# Patient Record
Sex: Female | Born: 1985 | Hispanic: No | Marital: Single | Smoking: Light tobacco smoker
Health system: Southern US, Community
[De-identification: ages and names within clinical notes are randomized; demographics above are authoritative.]

## PROBLEM LIST (undated history)

## (undated) DIAGNOSIS — B009 Herpesviral infection, unspecified: Secondary | ICD-10-CM

## (undated) DIAGNOSIS — R87619 Unspecified abnormal cytological findings in specimens from cervix uteri: Secondary | ICD-10-CM

## (undated) DIAGNOSIS — D219 Benign neoplasm of connective and other soft tissue, unspecified: Secondary | ICD-10-CM

## (undated) DIAGNOSIS — N926 Irregular menstruation, unspecified: Secondary | ICD-10-CM

## (undated) DIAGNOSIS — Z8619 Personal history of other infectious and parasitic diseases: Secondary | ICD-10-CM

## (undated) DIAGNOSIS — N760 Acute vaginitis: Secondary | ICD-10-CM

## (undated) DIAGNOSIS — N898 Other specified noninflammatory disorders of vagina: Secondary | ICD-10-CM

## (undated) DIAGNOSIS — Z319 Encounter for procreative management, unspecified: Secondary | ICD-10-CM

## (undated) DIAGNOSIS — B9689 Other specified bacterial agents as the cause of diseases classified elsewhere: Secondary | ICD-10-CM

## (undated) DIAGNOSIS — N939 Abnormal uterine and vaginal bleeding, unspecified: Secondary | ICD-10-CM

## (undated) HISTORY — DX: Personal history of other infectious and parasitic diseases: Z86.19

## (undated) HISTORY — DX: Abnormal uterine and vaginal bleeding, unspecified: N93.9

## (undated) HISTORY — PX: MOLE REMOVAL: SHX2046

## (undated) HISTORY — DX: Acute vaginitis: N76.0

## (undated) HISTORY — DX: Other specified bacterial agents as the cause of diseases classified elsewhere: B96.89

## (undated) HISTORY — DX: Unspecified abnormal cytological findings in specimens from cervix uteri: R87.619

## (undated) HISTORY — DX: Other specified noninflammatory disorders of vagina: N89.8

## (undated) HISTORY — DX: Irregular menstruation, unspecified: N92.6

## (undated) HISTORY — PX: THERAPEUTIC ABORTION: SHX798

## (undated) HISTORY — DX: Benign neoplasm of connective and other soft tissue, unspecified: D21.9

## (undated) HISTORY — DX: Herpesviral infection, unspecified: B00.9

## (undated) HISTORY — DX: Encounter for procreative management, unspecified: Z31.9

---

## 2010-02-28 ENCOUNTER — Other Ambulatory Visit: Admission: RE | Admit: 2010-02-28 | Discharge: 2010-02-28 | Payer: Self-pay | Admitting: Obstetrics and Gynecology

## 2012-03-12 ENCOUNTER — Other Ambulatory Visit (HOSPITAL_COMMUNITY)
Admission: RE | Admit: 2012-03-12 | Discharge: 2012-03-12 | Disposition: A | Discharge status: Home / Self Care | Payer: Self-pay | Source: Ambulatory Visit | Attending: Obstetrics & Gynecology | Admitting: Obstetrics & Gynecology

## 2012-03-12 DIAGNOSIS — Z01419 Encounter for gynecological examination (general) (routine) without abnormal findings: Secondary | ICD-10-CM | POA: Insufficient documentation

## 2012-09-22 ENCOUNTER — Ambulatory Visit: Payer: Self-pay | Admitting: Obstetrics & Gynecology

## 2012-10-21 ENCOUNTER — Other Ambulatory Visit: Payer: Self-pay | Admitting: Obstetrics & Gynecology

## 2013-03-22 ENCOUNTER — Ambulatory Visit (INDEPENDENT_AMBULATORY_CARE_PROVIDER_SITE_OTHER): Payer: Private Health Insurance - Indemnity | Admitting: Adult Health

## 2013-03-22 ENCOUNTER — Other Ambulatory Visit (HOSPITAL_COMMUNITY)
Admission: RE | Admit: 2013-03-22 | Discharge: 2013-03-22 | Disposition: A | Discharge status: Home / Self Care | Payer: Managed Care, Other (non HMO) | Source: Ambulatory Visit | Attending: Adult Health | Admitting: Adult Health

## 2013-03-22 ENCOUNTER — Encounter: Payer: Self-pay | Admitting: Adult Health

## 2013-03-22 VITALS — BP 110/60 | HR 78 | Ht 67.0 in | Wt 157.0 lb

## 2013-03-22 DIAGNOSIS — Z01419 Encounter for gynecological examination (general) (routine) without abnormal findings: Secondary | ICD-10-CM | POA: Insufficient documentation

## 2013-03-22 DIAGNOSIS — Z113 Encounter for screening for infections with a predominantly sexual mode of transmission: Secondary | ICD-10-CM | POA: Insufficient documentation

## 2013-03-22 DIAGNOSIS — Z309 Encounter for contraceptive management, unspecified: Secondary | ICD-10-CM

## 2013-03-22 LAB — CBC
Platelets: 305 10*3/uL (ref 150–400)
RDW: 13.6 % (ref 11.5–15.5)
WBC: 8.4 10*3/uL (ref 4.0–10.5)

## 2013-03-22 MED ORDER — NORGESTREL-ETHINYL ESTRADIOL 0.3-30 MG-MCG PO TABS: 1.0000 | ORAL_TABLET | Freq: Every day | ORAL | Status: DC

## 2013-03-22 NOTE — Patient Instructions (Addendum)
Physical in 1 year Mammogram at 41 Follow labs by my chart or phone

## 2013-03-22 NOTE — Progress Notes (Signed)
Patient ID: Anita Moody, female   DOB: 02-01-1986, 27 y.o.   MRN: [id number] History of Present Illness: Anita Moody is a 27 year old black female in for a pap and physical and has had some blurred vision at works, has glasses, and she has vaginal dryness at times.  Current Medications, Allergies, Past Medical History, Past Surgical History, Family History and Social History were reviewed in [employer] record.     Review of Systems: Patient denies any headaches,shortness of breath, chest pain, abdominal pain, problems with bowel movements, urination, or intercourse. No joint pain or mood changes, she has noticed blurred vision at times at work.And some vaginal dryness.    Physical Exam:BP 110/60  Pulse 78  Ht 5\' 7"  (1.702 m)  Wt 157 lb (71.215 kg)  BMI 24.58 kg/m2  LMP 02/19/2013 General:  Well developed, well nourished, no acute distress Skin:  Warm and dry Neck:  Midline trachea, normal thyroid Lungs; Clear to auscultation bilaterally Breast:  No dominant palpable mass, retraction, or nipple discharge Cardiovascular: Regular rate and rhythm Abdomen:  Soft, non tender, no hepatosplenomegaly Pelvic:  External genitalia is normal in appearance.  The vagina is normal in appearance. The cervix is bulbous.Pap with GC/CHL performed.  Uterus is felt to be normal size, shape, and contour.  No                adnexal masses or tenderness noted. Extremities:  No swelling or varicosities noted Psych:Alert and cooperative seems happy She requests STD testing.  Impression: Yearly gyn exam Contraceptive management Std testing History HSV    Plan: Refilled cryselle x 1 year Physical in 1 year Mammogram at 41  Check CBC,CMP,TSH,HIV,RPR, Hep B&C, follow up by phone or my chart Try luvena for vaginal moisture

## 2013-03-23 ENCOUNTER — Telehealth: Payer: Self-pay | Admitting: Adult Health

## 2013-03-23 LAB — COMPREHENSIVE METABOLIC PANEL
ALT: 16 U/L (ref 0–35)
AST: 18 U/L (ref 0–37)
Albumin: 4.3 g/dL (ref 3.5–5.2)
Alkaline Phosphatase: 38 U/L — ABNORMAL LOW (ref 39–117)
Calcium: 9.5 mg/dL (ref 8.4–10.5)
Chloride: 105 mEq/L (ref 96–112)
Creat: 0.76 mg/dL (ref 0.50–1.10)
Potassium: 4.1 mEq/L (ref 3.5–5.3)

## 2013-03-23 LAB — HEPATITIS B SURFACE ANTIGEN: Hepatitis B Surface Ag: NEGATIVE

## 2013-03-23 NOTE — Telephone Encounter (Signed)
Pt aware of labs  

## 2013-04-27 ENCOUNTER — Telehealth: Payer: Self-pay | Admitting: *Deleted

## 2013-04-27 NOTE — Telephone Encounter (Signed)
Pt wants to know what all STD labs was done on 03/22/2013. Pt informed RPR, GC/CHL on Pap, Hep C and B, HIV antibody and all WNL. Pt verbalized understanding.

## 2014-03-24 ENCOUNTER — Other Ambulatory Visit: Payer: Private Health Insurance - Indemnity | Admitting: Adult Health

## 2014-04-14 ENCOUNTER — Ambulatory Visit (INDEPENDENT_AMBULATORY_CARE_PROVIDER_SITE_OTHER): Payer: Private Health Insurance - Indemnity | Admitting: Adult Health

## 2014-04-14 ENCOUNTER — Encounter: Payer: Self-pay | Admitting: Adult Health

## 2014-04-14 VITALS — BP 120/82 | HR 80 | Ht 65.0 in | Wt 162.5 lb

## 2014-04-14 DIAGNOSIS — Z319 Encounter for procreative management, unspecified: Secondary | ICD-10-CM

## 2014-04-14 DIAGNOSIS — N898 Other specified noninflammatory disorders of vagina: Secondary | ICD-10-CM

## 2014-04-14 DIAGNOSIS — Z01419 Encounter for gynecological examination (general) (routine) without abnormal findings: Secondary | ICD-10-CM

## 2014-04-14 HISTORY — DX: Encounter for procreative management, unspecified: Z31.9

## 2014-04-14 HISTORY — DX: Other specified noninflammatory disorders of vagina: N89.8

## 2014-04-14 NOTE — Patient Instructions (Signed)
Try astro glide Increase fore play Sex every other day 7-24 of cycle,pee before sex Take vitamins

## 2014-04-14 NOTE — Progress Notes (Signed)
Patient ID: Anita Moody, female   DOB: [birth year], 28 y.o.   MRN: [id number] History of Present Illness: Anita Moody is a 28 year old in for a gyn physical, she had a normal pap 03/22/13.She complains of vaginal dryness with sex and wants to be pregnant in near future.   Current Medications, Allergies, Past Medical History, Past Surgical History, Family History and Social History were reviewed in [employer] record.     Review of Systems: Patient denies any headaches, blurred vision, shortness of breath, chest pain, abdominal pain, problems with bowel movements, urination, or intercourse. No joint or mood swings.    Physical Exam:BP 120/82  Pulse 80  Ht 5\' 5"  (1.651 m)  Wt 162 lb 8 oz (73.71 kg)  BMI 27.04 kg/m2  LMP 04/08/2014 General:  Well developed, well nourished, no acute distress Skin:  Warm and dry Neck:  Midline trachea, normal thyroid Lungs; Clear to auscultation bilaterally Breast:  No dominant palpable mass, retraction, or nipple discharge Cardiovascular: Regular rate and rhythm Abdomen:  Soft, non tender, no hepatosplenomegaly Pelvic:  External genitalia is normal in appearance.  The vagina is normal in appearance.  The cervix is bulbous.  Uterus is felt to be normal size, shape, and contour.  No   adnexal masses or tenderness noted. Extremities:  No swelling or varicosities noted Psych:  No mood changes,alert and cooperative,seems happy   Impression: Well woman exam no pap Vaginal dryness Desires pregnancy   Plan: Try astro glide and increased foreplay Take prenatal vitamins Discussed timing of sex, and can take 6-18 months of trying Physical in 1 year

## 2014-05-08 ENCOUNTER — Encounter: Payer: Self-pay | Admitting: Adult Health

## 2014-06-26 ENCOUNTER — Ambulatory Visit: Payer: Private Health Insurance - Indemnity | Admitting: Obstetrics and Gynecology

## 2014-07-21 ENCOUNTER — Encounter: Payer: Self-pay | Admitting: Obstetrics & Gynecology

## 2014-07-21 ENCOUNTER — Ambulatory Visit (INDEPENDENT_AMBULATORY_CARE_PROVIDER_SITE_OTHER): Payer: BLUE CROSS/BLUE SHIELD | Admitting: Obstetrics & Gynecology

## 2014-07-21 VITALS — BP 108/60 | Ht 65.0 in | Wt 168.0 lb

## 2014-07-21 DIAGNOSIS — N921 Excessive and frequent menstruation with irregular cycle: Secondary | ICD-10-CM

## 2014-07-21 DIAGNOSIS — N946 Dysmenorrhea, unspecified: Secondary | ICD-10-CM

## 2014-07-21 MED ORDER — MEGESTROL ACETATE 40 MG PO TABS: ORAL_TABLET | ORAL | Status: DC

## 2014-07-21 MED ORDER — IBUPROFEN 800 MG PO TABS: 800.0000 mg | ORAL_TABLET | Freq: Three times a day (TID) | ORAL | Status: DC | PRN

## 2014-07-21 NOTE — Progress Notes (Signed)
Patient ID: Anita Moody, female   DOB: March 10, 1986, 29 y.o.   MRN: [id number] Chief Complaint  Patient presents with  . discuss infertility    had ultrasound done morehead/ fibroids    HPI Pt presents complaining of bleeding since 07/09/2014, never happened before and is heavy clots and painful Seen at [facility] sonogram essentially normal with small 1 cm myoma  Here for follow up consult from that ER visit  ROS No burning with urination, frequency or urgency No nausea, vomiting or diarrhea Nor fever chills or other constitutional symptoms   Blood pressure 108/60, height 5\' 5"  (1.651 m), weight 168 lb (76.204 kg), last menstrual period 06/21/2014.  EXAM Reviewed sonogram:  Essentially normal ovaries normal small 1 cm myoma  Assessment/Plan:  Menometrorrhagia Dysmenorrhea  Ibuprofen 800 mg prn  Megestrol to stop bleeding  Follow up if does not respond as expected

## 2014-09-04 ENCOUNTER — Telehealth: Payer: Self-pay | Admitting: Obstetrics & Gynecology

## 2014-09-04 NOTE — Telephone Encounter (Signed)
Pt states she has not had any vaginal bleeding since began the Megace 40 mg Dr. Elonda Husky prescribed.   Does she need to continue the Megace 40 mg daily since she is no longer bleeding?

## 2014-09-05 NOTE — Telephone Encounter (Signed)
Yes continue it as directed She will bleed if she stops taking it

## 2014-09-05 NOTE — Telephone Encounter (Signed)
Pt informed to continue Megace as Dr. Elonda Husky prescribed.

## 2014-09-15 ENCOUNTER — Telehealth: Payer: Self-pay | Admitting: *Deleted

## 2014-09-15 NOTE — Telephone Encounter (Signed)
Pt called stating that she has been on megace 40mg  and has started some light bleeding that's lasting a little while. Pt stated that she was told that she has a small fibroid as well. I advised the pt that she would need to make an appointment with a provider to discuss the above problem. Pt verbalized understanding and the call was switched up front for the pt to make an appointment.

## 2014-09-22 ENCOUNTER — Ambulatory Visit (INDEPENDENT_AMBULATORY_CARE_PROVIDER_SITE_OTHER): Payer: BLUE CROSS/BLUE SHIELD | Admitting: Adult Health

## 2014-09-22 ENCOUNTER — Encounter: Payer: Self-pay | Admitting: Adult Health

## 2014-09-22 VITALS — BP 122/72 | Ht 66.0 in | Wt 170.5 lb

## 2014-09-22 DIAGNOSIS — N939 Abnormal uterine and vaginal bleeding, unspecified: Secondary | ICD-10-CM

## 2014-09-22 DIAGNOSIS — Z319 Encounter for procreative management, unspecified: Secondary | ICD-10-CM

## 2014-09-22 DIAGNOSIS — Z3202 Encounter for pregnancy test, result negative: Secondary | ICD-10-CM | POA: Diagnosis not present

## 2014-09-22 HISTORY — DX: Abnormal uterine and vaginal bleeding, unspecified: N93.9

## 2014-09-22 LAB — POCT URINE PREGNANCY: PREG TEST UR: NEGATIVE

## 2014-09-22 MED ORDER — NORETHIN ACE-ETH ESTRAD-FE 1-20 MG-MCG(24) PO CHEW: 1.0000 | CHEWABLE_TABLET | Freq: Every day | ORAL | Status: DC

## 2014-09-22 NOTE — Patient Instructions (Signed)
Stop acticlate Stop megace Start minastrin Sunday Use condoms for 1 month Take prenatal vitamins Return in 3 months

## 2014-09-22 NOTE — Progress Notes (Signed)
Subjective:     Patient ID: Anita Moody, female   DOB: August 23, 1985, 29 y.o.   MRN: [id number]  HPI Anita Moody is a 29 year old Hispanic female in for bleeding on and off since December, megace helped but still spotted.Stil wants to be pregnant in near future.Will stop megace and try Minastrin and see how periods are.No pain.  Review of Systems  + bleeding, all other systems negative  Reviewed past medical,surgical, social and family history. Reviewed medications and allergies.     Objective:   Physical Exam BP 122/72 mmHg  Ht 5\' 6"  (1.676 m)  Wt 170 lb 8 oz (77.338 kg)  BMI 27.53 kg/m2  LMP 06/20/2014 (Approximate) UPT negative, Skin warm and dry.Pelvic: external genitalia is normal in appearance no lesions, vagina: tan discharge without odor,urethra has no lesions or masses noted, cervix:smooth and bulbous, uterus: normal size, shape and contour, non tender, no masses felt, adnexa: no masses or tenderness noted. Bladder is non tender and no masses felt. GC/CHL obtained.     Assessment:     AUB Desires pregnancy    Plan:    GC/CHL sent Stop megace Stop acticlate Rx minastrin take 1 daily start Sunday, use condoms for 1 month, 6 refills Take prenatal vitamins Follow up in 6 months

## 2014-09-24 LAB — GC/CHLAMYDIA PROBE AMP
Chlamydia trachomatis, NAA: NEGATIVE
Neisseria gonorrhoeae by PCR: NEGATIVE

## 2014-10-09 ENCOUNTER — Telehealth: Payer: Self-pay | Admitting: Adult Health

## 2014-10-09 NOTE — Telephone Encounter (Signed)
Spoke with pt. Pt has been on Minastrin x 3 weeks. Previously, she was on Megace and that helped with cramping. She stopped Megace and now she is having heavy bleeding and cramping. Pt don't want to go back on Megace. What do you advise? Thanks!! [place]

## 2014-10-09 NOTE — Telephone Encounter (Signed)
Spoke with pt letting her know to continue taking Minastrin and not to stop it. Can take a few cycles to get period under control sometimes. Pt taking Ibuprofen for cramps. Advised to continue. Pt voiced understanding. [place]

## 2014-12-25 ENCOUNTER — Ambulatory Visit: Payer: BLUE CROSS/BLUE SHIELD | Admitting: Adult Health

## 2015-01-02 ENCOUNTER — Telehealth: Payer: Self-pay | Admitting: *Deleted

## 2015-01-03 NOTE — Telephone Encounter (Signed)
Spoke with pt. Pt was put on birth control pills due to abnormal bleeding. After the 2nd month, she misplaced her pills after her period. This month her cycle has been going on for 12 days. She had Megace from before, and she started that yesterday and bleeding has lightened up. Pt has a scheduled appt next week with JAG since that's who she has been seeing for this. I advised since she was having no pain and bleeding was getting better, next week would be fine but if anything changed between now and then, call office. Pt voiced understanding. [place]

## 2015-01-09 ENCOUNTER — Ambulatory Visit: Payer: BLUE CROSS/BLUE SHIELD | Admitting: Adult Health

## 2015-01-17 ENCOUNTER — Ambulatory Visit (INDEPENDENT_AMBULATORY_CARE_PROVIDER_SITE_OTHER): Payer: BLUE CROSS/BLUE SHIELD | Admitting: Adult Health

## 2015-01-17 ENCOUNTER — Encounter: Payer: Self-pay | Admitting: Adult Health

## 2015-01-17 VITALS — BP 130/60 | HR 80 | Ht 66.0 in | Wt 171.0 lb

## 2015-01-17 DIAGNOSIS — Z3202 Encounter for pregnancy test, result negative: Secondary | ICD-10-CM

## 2015-01-17 DIAGNOSIS — Z319 Encounter for procreative management, unspecified: Secondary | ICD-10-CM

## 2015-01-17 DIAGNOSIS — N939 Abnormal uterine and vaginal bleeding, unspecified: Secondary | ICD-10-CM

## 2015-01-17 LAB — POCT URINE PREGNANCY: Preg Test, Ur: NEGATIVE

## 2015-01-17 MED ORDER — PRENATAL PLUS 27-1 MG PO TABS: 1.0000 | ORAL_TABLET | Freq: Every day | ORAL | Status: DC

## 2015-01-17 NOTE — Patient Instructions (Signed)
Take prenatal vitamin Stop megace Keep period calendar  Call if bleeding longer than 7 days will get [place] Have sex every other day, day 7-24

## 2015-01-17 NOTE — Progress Notes (Signed)
Subjective:     Patient ID: Anita Moody, female   DOB: 01-Jan-1986, 29 y.o.   MRN: [id number]  HPI Anita Moody is a 29 year old Hispanic female in complaining of abnormal bleeding, she was on minastrin and was fine but stopped it after 3 packs and then had bleeding for 12 days and started megace, has no bleeding now and she still wants to be pregnant.  Review of Systems   Patient denies any headaches, hearing loss, fatigue, blurred vision, shortness of breath, chest pain, abdominal pain, problems with bowel movements, urination, or intercourse. No joint pain or mood swings.See HPI for positives Reviewed past medical,surgical, social and family history. Reviewed medications and allergies.     Objective:   Physical Exam BP 130/60 mmHg  Pulse 80  Ht 5\' 6"  (1.676 m)  Wt 171 lb (77.565 kg)  BMI 27.61 kg/m2  LMP 12/21/2014 (Approximate) UPT negative, Skin warm and dry.Pelvic: external genitalia is normal in appearance no lesions, vagina: scant discharge without odor,urethra has no lesions or masses noted, cervix:smooth and bulbous, uterus: normal size, shape and contour, non tender, no masses felt, adnexa: no masses or tenderness noted. Bladder is non tender and no masses felt.No bleeding now    Assessment:     AUB Desires pregnancy    Plan:     Rx prenatal plus #30 take 1 daily with 11 refills Stop megace Keep period calendar Call if bleeds longer than 7 days, will get [place] Have sex every other day day 7-24 of cycle Return in 3 months for pap and physical

## 2015-02-20 ENCOUNTER — Ambulatory Visit: Payer: BLUE CROSS/BLUE SHIELD | Admitting: Adult Health

## 2015-04-20 ENCOUNTER — Other Ambulatory Visit: Payer: BLUE CROSS/BLUE SHIELD | Admitting: Adult Health

## 2015-06-13 ENCOUNTER — Other Ambulatory Visit: Payer: BLUE CROSS/BLUE SHIELD | Admitting: Adult Health

## 2015-08-02 ENCOUNTER — Other Ambulatory Visit: Payer: Self-pay | Admitting: Obstetrics & Gynecology

## 2015-09-26 ENCOUNTER — Telehealth: Payer: Self-pay | Admitting: Adult Health

## 2015-09-26 ENCOUNTER — Other Ambulatory Visit: Payer: Self-pay | Admitting: Adult Health

## 2015-09-26 MED ORDER — METRONIDAZOLE 500 MG PO TABS: 500.0000 mg | ORAL_TABLET | Freq: Two times a day (BID) | ORAL | Status: DC

## 2015-09-26 NOTE — Telephone Encounter (Signed)
Left message will rx flagyl but if not better need to see by appt

## 2015-09-26 NOTE — Telephone Encounter (Signed)
Pt requesting refill of Flagyl (would prefer tablet) for BV. Pt states Derrek Monaco, NP has prescribed med in the past for on going BV she had after her menstrual cycles. Please advise.

## 2015-09-27 ENCOUNTER — Other Ambulatory Visit: Payer: Self-pay | Admitting: Adult Health

## 2015-09-27 MED ORDER — NORETHIN ACE-ETH ESTRAD-FE 1-20 MG-MCG(24) PO CHEW: 1.0000 | CHEWABLE_TABLET | Freq: Every day | ORAL | Status: DC

## 2015-09-27 NOTE — Telephone Encounter (Signed)
Spoke with pt. Pt has an appt 4/21 for pap. She is requesting a refill on Minastrin. Pt not due to start new pack until Sunday. JAG advised she would refill it. [place]

## 2015-09-27 NOTE — Telephone Encounter (Signed)
Left message x 1. JSY 

## 2015-10-26 ENCOUNTER — Other Ambulatory Visit: Payer: BLUE CROSS/BLUE SHIELD | Admitting: Adult Health

## 2015-11-16 ENCOUNTER — Ambulatory Visit (INDEPENDENT_AMBULATORY_CARE_PROVIDER_SITE_OTHER): Payer: BLUE CROSS/BLUE SHIELD | Admitting: Adult Health

## 2015-11-16 ENCOUNTER — Encounter: Payer: Self-pay | Admitting: Adult Health

## 2015-11-16 ENCOUNTER — Other Ambulatory Visit (HOSPITAL_COMMUNITY)
Admission: RE | Admit: 2015-11-16 | Discharge: 2015-11-16 | Disposition: A | Discharge status: Home / Self Care | Payer: BLUE CROSS/BLUE SHIELD | Source: Ambulatory Visit | Attending: Adult Health | Admitting: Adult Health

## 2015-11-16 VITALS — BP 124/80 | HR 84 | Ht 65.0 in | Wt 166.0 lb

## 2015-11-16 DIAGNOSIS — Z01411 Encounter for gynecological examination (general) (routine) with abnormal findings: Secondary | ICD-10-CM

## 2015-11-16 DIAGNOSIS — Z01419 Encounter for gynecological examination (general) (routine) without abnormal findings: Secondary | ICD-10-CM

## 2015-11-16 DIAGNOSIS — Z113 Encounter for screening for infections with a predominantly sexual mode of transmission: Secondary | ICD-10-CM | POA: Insufficient documentation

## 2015-11-16 DIAGNOSIS — Z319 Encounter for procreative management, unspecified: Secondary | ICD-10-CM

## 2015-11-16 DIAGNOSIS — N926 Irregular menstruation, unspecified: Secondary | ICD-10-CM

## 2015-11-16 HISTORY — DX: Irregular menstruation, unspecified: N92.6

## 2015-11-16 MED ORDER — PRENATAL PLUS 27-1 MG PO TABS: 1.0000 | ORAL_TABLET | Freq: Every day | ORAL | Status: DC

## 2015-11-16 NOTE — Progress Notes (Signed)
Patient ID: Anita Moody, female   DOB: 12-07-1985, 30 y.o.   MRN: [id number] History of Present Illness: Anita Moody is a 30 year old female in for a well woman gyn exam and pap,she is complaining of irregular periods, may spot up to 2 weeks, is light  And has no pain, and would still like to be pregnant.   Current Medications, Allergies, Past Medical History, Past Surgical History, Family History and Social History were reviewed in [employer] record.     Review of Systems: Patient denies any headaches, hearing loss, fatigue, blurred vision, shortness of breath, chest pain, abdominal pain, problems with bowel movements, urination, or intercourse. No joint pain or mood swings.See HPI for positives.    Physical Exam:BP 124/80 mmHg  Pulse 84  Ht 5\' 5"  (1.651 m)  Wt 166 lb (75.297 kg)  BMI 27.62 kg/m2  LMP 10/24/2015 (Approximate) General:  Well developed, well nourished, no acute distress Skin:  Warm and dry,no rashes Neck:  Midline trachea, normal thyroid, good ROM, no lymphadenopathy Lungs; Clear to auscultation bilaterally Breast:  No dominant palpable mass, retraction, or nipple discharge Cardiovascular: Regular rate and rhythm Abdomen:  Soft, non tender, no hepatosplenomegaly Pelvic:  External genitalia is normal in appearance, no lesions.  The vagina is normal in appearance. Urethra has no lesions or masses. The cervix is bulbous and smooth, pap with GC/CHL performed.  Uterus is felt to be normal size, shape, and contour.  No adnexal masses or tenderness noted.Bladder is non tender, no masses felt. Extremities/musculoskeletal:  No swelling or varicosities noted, no clubbing or cyanosis Psych:  No mood changes, alert and cooperative,seems happy   Impression: Well woman gyn exam and pap Irregular periods Desires pregnancy    Plan: Rx prenatal plus #30 take 1 daily with 11 refills Keep period calendar and call with next period, will check progesterone level  day 21 Physical in 1 year, pap in 3 if normal

## 2015-11-16 NOTE — Patient Instructions (Addendum)
Take prenatal vitamins Call with next period will check progesterone level Keep period calendar

## 2015-11-20 LAB — CYTOLOGY - PAP

## 2015-11-22 ENCOUNTER — Encounter: Payer: Self-pay | Admitting: Adult Health

## 2015-11-22 ENCOUNTER — Telehealth: Payer: Self-pay | Admitting: Adult Health

## 2015-11-22 DIAGNOSIS — R87619 Unspecified abnormal cytological findings in specimens from cervix uteri: Secondary | ICD-10-CM

## 2015-11-22 HISTORY — DX: Unspecified abnormal cytological findings in specimens from cervix uteri: R87.619

## 2015-11-22 NOTE — Telephone Encounter (Signed)
Pt aware pap LGSIL, needs colpo, will make appt with Dr Elonda Husky

## 2015-11-23 ENCOUNTER — Ambulatory Visit (INDEPENDENT_AMBULATORY_CARE_PROVIDER_SITE_OTHER): Payer: BLUE CROSS/BLUE SHIELD | Admitting: Obstetrics & Gynecology

## 2015-11-23 ENCOUNTER — Encounter: Payer: Self-pay | Admitting: Obstetrics & Gynecology

## 2015-11-23 VITALS — BP 120/80 | HR 72 | Ht 65.0 in | Wt 168.0 lb

## 2015-11-23 DIAGNOSIS — R87612 Low grade squamous intraepithelial lesion on cytologic smear of cervix (LGSIL): Secondary | ICD-10-CM

## 2015-11-23 DIAGNOSIS — IMO0002 Reserved for concepts with insufficient information to code with codable children: Secondary | ICD-10-CM

## 2015-11-23 DIAGNOSIS — N870 Mild cervical dysplasia: Secondary | ICD-10-CM

## 2015-11-23 DIAGNOSIS — N898 Other specified noninflammatory disorders of vagina: Secondary | ICD-10-CM | POA: Diagnosis not present

## 2015-11-23 DIAGNOSIS — B3731 Acute candidiasis of vulva and vagina: Secondary | ICD-10-CM

## 2015-11-23 DIAGNOSIS — B373 Candidiasis of vulva and vagina: Secondary | ICD-10-CM

## 2015-11-23 MED ORDER — TERCONAZOLE 0.4 % VA CREA: 1.0000 | TOPICAL_CREAM | Freq: Every day | VAGINAL | Status: DC

## 2015-11-23 NOTE — Progress Notes (Signed)
Patient ID: Anita Moody, female   DOB: [birth year], 30 y.o.   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 The prior pap showed no abnormalities.  Prior cervical/vaginal disease: . Prior cervical treatment: .  Smoker:  Yes.  1 cigarette/week New sexual partner:  No.  : time frame:    History of abnormal Pap: yes, several years ago  Procedure Details  The risks and benefits of the procedure and Written informed consent obtained.  Speculum placed in vagina and excellent visualization of cervix achieved, cervix swabbed x 3 with acetic acid solution.  Findings: Cervix: no visible lesions, no mosaicism, no punctation and no abnormal vasculature; no biopsies taken. Vaginal inspection: normal without visible lesions. Vulvar colposcopy: vulvar colposcopy not performed.  Specimens: none  Complications: none.  Plan: Colposcopy without even any HPV changes, repeat Pap next May/1 year  Also has a yeast infection:  Meds ordered this encounter  Medications  . terconazole (TERAZOL 7) 0.4 % vaginal cream    Sig: Place 1 applicator vaginally at bedtime.    Dispense:  45 g    Refill:  0

## 2015-12-04 ENCOUNTER — Telehealth: Payer: Self-pay | Admitting: *Deleted

## 2015-12-04 DIAGNOSIS — Z319 Encounter for procreative management, unspecified: Secondary | ICD-10-CM

## 2015-12-04 NOTE — Telephone Encounter (Signed)
Get progesterone level 6/21, have sex every other day, day 7-24 of cycle

## 2015-12-04 NOTE — Telephone Encounter (Signed)
Spoke with pt. Pt started period this am. Wanted to let you know so she could have labs drawn for ovulation. Thanks!! [place]

## 2015-12-04 NOTE — Telephone Encounter (Signed)
Left message x 1. JSY 

## 2016-01-14 ENCOUNTER — Telehealth: Payer: Self-pay | Admitting: Adult Health

## 2016-01-14 NOTE — Telephone Encounter (Signed)
Pt started period 7/9 and wants progesterone level checked, do it 7/29

## 2016-02-03 LAB — PROGESTERONE: PROGESTERONE: 0.3 ng/mL

## 2016-02-05 ENCOUNTER — Telehealth: Payer: Self-pay | Admitting: Obstetrics & Gynecology

## 2016-02-05 NOTE — Telephone Encounter (Signed)
Pt informed of Progesterone level of 0.3 from 02/02/2016. Please advise.

## 2016-02-05 NOTE — Telephone Encounter (Signed)
Pt aware of progesterone level and did not ovulate, if wants clomid make appt to discuss

## 2016-02-05 NOTE — Telephone Encounter (Signed)
Pt called stating that she would like a call back from the nurse regarding her results. Please contact pt

## 2016-02-11 ENCOUNTER — Encounter: Payer: Self-pay | Admitting: Adult Health

## 2016-02-11 ENCOUNTER — Ambulatory Visit (INDEPENDENT_AMBULATORY_CARE_PROVIDER_SITE_OTHER): Payer: BLUE CROSS/BLUE SHIELD | Admitting: Adult Health

## 2016-02-11 VITALS — BP 100/62 | HR 70 | Ht 65.0 in | Wt 172.0 lb

## 2016-02-11 DIAGNOSIS — Z319 Encounter for procreative management, unspecified: Secondary | ICD-10-CM

## 2016-02-11 MED ORDER — CLOMIPHENE CITRATE 50 MG PO TABS: 50.0000 mg | ORAL_TABLET | Freq: Every day | ORAL | 1 refills | Status: DC

## 2016-02-11 NOTE — Progress Notes (Signed)
Subjective:     Patient ID: Anita Moody, female   DOB: 1985-08-30, 30 y.o.   MRN: [id number]  HPI Anita Moody is a 30 year old female in to discuss clomid to induce ovulation, she has been trying for about 2 years, her last progesterone level was 0.3.  Review of Systems Patient denies any headaches, hearing loss, fatigue, blurred vision, shortness of breath, chest pain, abdominal pain, problems with bowel movements, urination, or intercourse. No joint pain or mood swings. Reviewed past medical,surgical, social and family history. Reviewed medications and allergies.     Objective:   Physical Exam BP 100/62 (BP Location: Left Arm, Patient Position: Sitting, Cuff Size: Normal)   Pulse 70   Ht 5\' 5"  (1.651 m)   Wt 172 lb (78 kg)   LMP 02/09/2016   BMI 28.62 kg/m  Skin warm and dry. Lungs: clear to ausculation bilaterally. Cardiovascular: regular rate and rhythm.   Discussed clomid and she wants to try it. She is aware 7 % chance of twins and <1 % for triplets.  Assessment:     Desires pregnancy    Plan:       Check progesterone level 8/25 Rx clomid 50 mg #5 take 1 daily starting today for 5 days with 1 refill Have sex every other day, day 7-24 of cycle Will talk when progesterone back  Review handout on clomid

## 2016-02-11 NOTE — Patient Instructions (Signed)
CLOMID INSTRUCTIONS  WHY USE IT? Clomid helps your ovaries to release eggs (ovulate).  HOW TO USE IT? Clomid is taken as a pill usually on days 5,6,7,8, & 9 of your cycle.  Day 1 is the first day of your period. The dose or duration may be changed to achieve ovulation.  Provera (progesterone) may first be used to bring on a period for some patients. The day of ovulation on Clomid is usually between cycle day 14 and 17.  Having sexual intercourse at least every other day between cycle day 13 and 18 will improve your chances of becoming pregnant during the Clomid cycle.  You may monitor your ovulation using basal body temperature charts or with ovulation kits.  If using the ovulation predictor kits, having intercourse the day of the surge and the two days following is recommended. If you get your period, call when it starts for an appointment with your doctor, so that an exam may be done, and another Clomid cycle can be considered if appropriate. If you do not get a period by day 35 of the cycle, please get a blood pregnancy test.  If it is negative, speak to your doctor for instructions to bring on another period and to plan a follow-up appointment.  THINGS TO KNOW: If you get pregnant while using Clomid, your chance of twins is 7%m and triplets is less than 1%. Some studies have suggested the use of "fertility drugs" may increase your risk of ovarian cancers in the future.  It is unclear if these drugs increase the risk, or people who have problems with fertility are prone for these cancers.  If there is an actual risk, it is very low.  If you have a history of liver problems or ovarian cancer, it may be wise to avoid this medication.  SIDE EFFECTS:  The most common side effect is hot flashes (20%).  Breast tenderness, headaches, nausea, bloating may also occur at different times.  Less than 3/1,000 people have dryness or loss of hair.  Persistent ovarian cysts may form from the use of this  medication.  Ovarian hyperstimulation syndrome is a rare side effect at low doses.  Visual changes like flashes of light or blurring.  Start clomid today  Sex every day day 7-24 of cycle  Check progesterone level 8/25

## 2016-03-17 ENCOUNTER — Telehealth: Payer: Self-pay | Admitting: Adult Health

## 2016-03-17 DIAGNOSIS — Z319 Encounter for procreative management, unspecified: Secondary | ICD-10-CM

## 2016-03-17 NOTE — Telephone Encounter (Signed)
Spoke with pt. Pt's period started Sunday, Sept 3 and stopped Tuesday. Pt states she is not bleeding her full period, just bleeding 1-2 days. She takes Clomid when she's bleeding. Can she take a pill to cause period? Also, when can she have progesterone checked? Thanks! [place]

## 2016-03-17 NOTE — Telephone Encounter (Signed)
Pt was advised to continue Clomid, take for 5 days and can have Progesterone checked on 9/23 if she desires. Pt does want Progesterone checked so I will put order in. Pt voiced understanding. [place]

## 2016-03-30 LAB — PROGESTERONE: Progesterone: 5.3 ng/mL

## 2016-04-01 ENCOUNTER — Telehealth: Payer: Self-pay | Admitting: Adult Health

## 2016-04-01 NOTE — Telephone Encounter (Signed)
Left message progesterone was 5.3, which is better, let me know if has period or not

## 2016-04-02 ENCOUNTER — Telehealth: Payer: Self-pay | Admitting: *Deleted

## 2016-04-02 NOTE — Telephone Encounter (Signed)
Progesterone level 5.3 which better, call if period starts, or not, message left

## 2016-04-09 ENCOUNTER — Telehealth: Payer: Self-pay | Admitting: Adult Health

## 2016-04-09 NOTE — Telephone Encounter (Signed)
Left message x 1. JSY 

## 2016-04-10 ENCOUNTER — Telehealth: Payer: Self-pay | Admitting: Adult Health

## 2016-04-10 NOTE — Telephone Encounter (Signed)
Spoke with pt. Pt started period Tuesday, 10/3 and she took her 1st Clomid on Wednesday, 10/4. Pt wanted to let you know. [place]

## 2016-04-10 NOTE — Telephone Encounter (Signed)
Duplicate message. [place]

## 2016-04-10 NOTE — Telephone Encounter (Signed)
Left message to have sex days 7-24 of cycle, and if wants progesterone level checked that would be 10/23 just let me know

## 2016-10-27 ENCOUNTER — Other Ambulatory Visit: Payer: Self-pay | Admitting: Obstetrics & Gynecology

## 2016-11-04 ENCOUNTER — Telehealth: Payer: Self-pay | Admitting: *Deleted

## 2016-11-04 MED ORDER — METRONIDAZOLE 500 MG PO TABS: 500.0000 mg | ORAL_TABLET | Freq: Two times a day (BID) | ORAL | 1 refills | Status: DC

## 2016-11-04 NOTE — Telephone Encounter (Signed)
She complains of BV has discharge with odor, will rx flagyl, has used in past and it worked, has appt 11/18/16

## 2016-11-04 NOTE — Telephone Encounter (Signed)
Patient called stating she just finished her cycle and now has BV (odor, discharge). She would like for you to refill Flagyl if possible. She has an appointment on 5/15 for pap/physical. Please advise.

## 2016-11-18 ENCOUNTER — Other Ambulatory Visit: Payer: BLUE CROSS/BLUE SHIELD | Admitting: Adult Health

## 2017-02-02 ENCOUNTER — Other Ambulatory Visit: Payer: Self-pay | Admitting: Obstetrics and Gynecology

## 2017-11-09 ENCOUNTER — Emergency Department (HOSPITAL_COMMUNITY)
Admission: EM | Admit: 2017-11-09 | Discharge: 2017-11-09 | Disposition: A | Discharge status: Home / Self Care | Payer: Managed Care, Other (non HMO) | Attending: Emergency Medicine | Admitting: Emergency Medicine

## 2017-11-09 ENCOUNTER — Encounter (HOSPITAL_COMMUNITY): Payer: Self-pay | Admitting: Emergency Medicine

## 2017-11-09 ENCOUNTER — Emergency Department (HOSPITAL_COMMUNITY): Payer: Managed Care, Other (non HMO)

## 2017-11-09 ENCOUNTER — Other Ambulatory Visit: Payer: Self-pay

## 2017-11-09 DIAGNOSIS — M542 Cervicalgia: Secondary | ICD-10-CM | POA: Insufficient documentation

## 2017-11-09 DIAGNOSIS — M549 Dorsalgia, unspecified: Secondary | ICD-10-CM | POA: Diagnosis present

## 2017-11-09 DIAGNOSIS — R52 Pain, unspecified: Secondary | ICD-10-CM

## 2017-11-09 DIAGNOSIS — F17210 Nicotine dependence, cigarettes, uncomplicated: Secondary | ICD-10-CM | POA: Insufficient documentation

## 2017-11-09 MED ORDER — METHOCARBAMOL 500 MG PO TABS: 500.0000 mg | ORAL_TABLET | Freq: Two times a day (BID) | ORAL | 0 refills | Status: AC

## 2017-11-09 MED ORDER — DICLOFENAC SODIUM 50 MG PO TBEC: 50.0000 mg | DELAYED_RELEASE_TABLET | Freq: Two times a day (BID) | ORAL | 0 refills | Status: AC

## 2017-11-09 NOTE — ED Triage Notes (Signed)
Pt c/o of upper back pain.  Denies any new injuries.  Pt states "its probably related to my work"

## 2017-11-09 NOTE — Discharge Instructions (Addendum)
Return if any problems.

## 2017-11-10 NOTE — ED Provider Notes (Signed)
[facility] EMERGENCY DEPARTMENT Provider Note   CSN: [id number] Arrival date & time: 11/09/17  1237     History   Chief Complaint Chief Complaint  Patient presents with  . Back Pain    HPI Anita Moody is a 32 y.o. female.  The history is provided by the patient. No language interpreter was used.  Back Pain   This is a new problem. The current episode started more than 1 week ago. The problem occurs constantly. The pain is associated with no known injury. The pain is present in the thoracic spine. The quality of the pain is described as aching. The pain does not radiate. The pain is moderate. The symptoms are aggravated by certain positions. Stiffness is present all day. She has tried nothing for the symptoms.  Pt reports she stands on hard floors at work.  Pt complains of pain in her neck and her upper back.     Past Medical History:  Diagnosis Date  . Abnormal Pap smear of cervix 11/22/2015  . Abnormal uterine bleeding (AUB) 09/22/2014  . BV (bacterial vaginosis)   . Fibroids   . HSV-2 (herpes simplex virus 2) infection   . Hx of chlamydia infection   . Hx of gonorrhea   . Irregular periods 11/16/2015  . Patient desires pregnancy 04/14/2014  . Vaginal dryness 04/14/2014    Patient Active Problem List   Diagnosis Date Noted  . Abnormal Pap smear of cervix 11/22/2015  . Irregular periods 11/16/2015  . Abnormal uterine bleeding (AUB) 09/22/2014  . Vaginal dryness 04/14/2014  . Patient desires pregnancy 04/14/2014    Past Surgical History:  Procedure Laterality Date  . MOLE REMOVAL     right thumb  . THERAPEUTIC ABORTION     x 2     OB History    Gravida  4   Para  2   Term      Preterm      AB  2   Living  2     SAB      TAB  2   Ectopic      Multiple      Live Births  2            Home Medications    Prior to Admission medications   Medication Sig Start Date End Date Taking? Authorizing Provider  ibuprofen (ADVIL,MOTRIN) 200 MG tablet  Take 800 mg by mouth every 6 (six) hours as needed.   Yes [provider]  Multiple Vitamin (MULTIVITAMIN) tablet Take 1 tablet by mouth daily.   Yes [provider]  diclofenac (VOLTAREN) 50 MG EC tablet Take 1 tablet (50 mg total) by mouth 2 (two) times daily. 11/09/17   Fransico Meadow, PA-C  ibuprofen (ADVIL,MOTRIN) 800 MG tablet TAKE (1) TABLET EVERY EIGHT HOURS AS NEEDED. 10/27/16   Florian Buff, MD  methocarbamol (ROBAXIN) 500 MG tablet Take 1 tablet (500 mg total) by mouth 2 (two) times daily. 11/09/17   Fransico Meadow, PA-C    Family History Family History  Problem Relation Age of Onset  . Diabetes Father   . Hyperlipidemia Father   . Other Father        heart issues  . Other Paternal Aunt        kidney stones  . Diabetes Paternal Grandfather   . Dementia Paternal Grandmother   . Other Brother        heart murmur  . Other Son  inactive heart murmur  . Fibroids Mother     Social History Social History   Tobacco Use  . Smoking status: Light Tobacco Smoker    Years: 7.00    Types: Cigarettes  . Smokeless tobacco: Never Used  . Tobacco comment: 1-2 cig every other day  Substance Use Topics  . Alcohol use: Yes    Alcohol/week: 2.0 oz    Types: 4 Standard drinks or equivalent per week    Comment: occ  . Drug use: No     Allergies   Coconut fatty acids   Review of Systems Review of Systems  Musculoskeletal: Positive for back pain.  All other systems reviewed and are negative.    Physical Exam Updated Vital Signs BP 136/86 (BP Location: Right Arm)   Pulse 81   Temp 98.2 F (36.8 C) (Oral)   Resp 17   Ht 5\' 5"  (1.651 m)   Wt 76.5 kg (168 lb 9 oz)   LMP 10/26/2017   SpO2 100%   BMI 28.05 kg/m   Physical Exam  Constitutional: She appears well-developed and well-nourished.  HENT:  Head: Normocephalic.  Eyes: Pupils are equal, round, and reactive to light.  Cardiovascular: Normal rate.  Pulmonary/Chest: Effort normal.    Musculoskeletal: She exhibits tenderness.  Tender upper thoracic spine and cervical spine,  Tender trapezius to palpation   Neurological: She is alert.  Skin: Skin is warm.  Psychiatric: She has a normal mood and affect.  Nursing note and vitals reviewed.    ED Treatments / Results  Labs (all labs ordered are listed, but only abnormal results are displayed) Labs Reviewed - No data to display  EKG None  Radiology Dg Cervical Spine Complete  Result Date: 11/09/2017 CLINICAL DATA:  Neck pain for couple months. Worsening. No known injury. EXAM: CERVICAL SPINE - COMPLETE 4+ VIEW COMPARISON:  None. FINDINGS: Mild straightening of the normal cervical lordosis. No significant subluxation. No compression fracture or vertebral body abnormality. No prevertebral soft tissue swelling. No foraminal narrowing. AP and odontoid views unremarkable. IMPRESSION: Other than mild straightening of the normal cervical lordosis, unremarkable cervical spine radiographs. Electronically Signed   By: Staci Righter M.D.   On: 11/09/2017 13:51   Dg Thoracic Spine W/swimmers  Result Date: 11/09/2017 CLINICAL DATA:  Back pain.  No known injury. EXAM: THORACIC SPINE - 3 VIEWS COMPARISON:  None. FINDINGS: There is no evidence of thoracic spine fracture. Alignment is normal. No other significant bone abnormalities are identified. IMPRESSION: Negative. Electronically Signed   By: Staci Righter M.D.   On: 11/09/2017 13:52    Procedures Procedures (including critical care time)  Medications Ordered in ED Medications - No data to display   Initial Impression / Assessment and Plan / ED Course  I have reviewed the triage vital signs and the nursing notes.  Pertinent labs & imaging results that were available during my care of the patient were reviewed by me and considered in my medical decision making (see chart for details).     An After Visit Summary was printed and given to the patient.   Final Clinical  Impressions(s) / ED Diagnoses   Final diagnoses:  Neck pain    ED Discharge Orders        Ordered    methocarbamol (ROBAXIN) 500 MG tablet  2 times daily     11/09/17 1501    diclofenac (VOLTAREN) 50 MG EC tablet  2 times daily     11/09/17 1501  Fransico Meadow, PA-C 11/10/17 [id number]    Nat Christen, MD 11/17/17 [phone]

## 2019-03-23 IMAGING — DX DG CERVICAL SPINE COMPLETE 4+V
5 series · 5 of 5 positions shown · non-contrast
Comparison: None.

CLINICAL DATA: Neck pain for couple months. Worsening. No known
injury.

EXAM:
CERVICAL SPINE - COMPLETE 4+ VIEW

[c-spine lat]
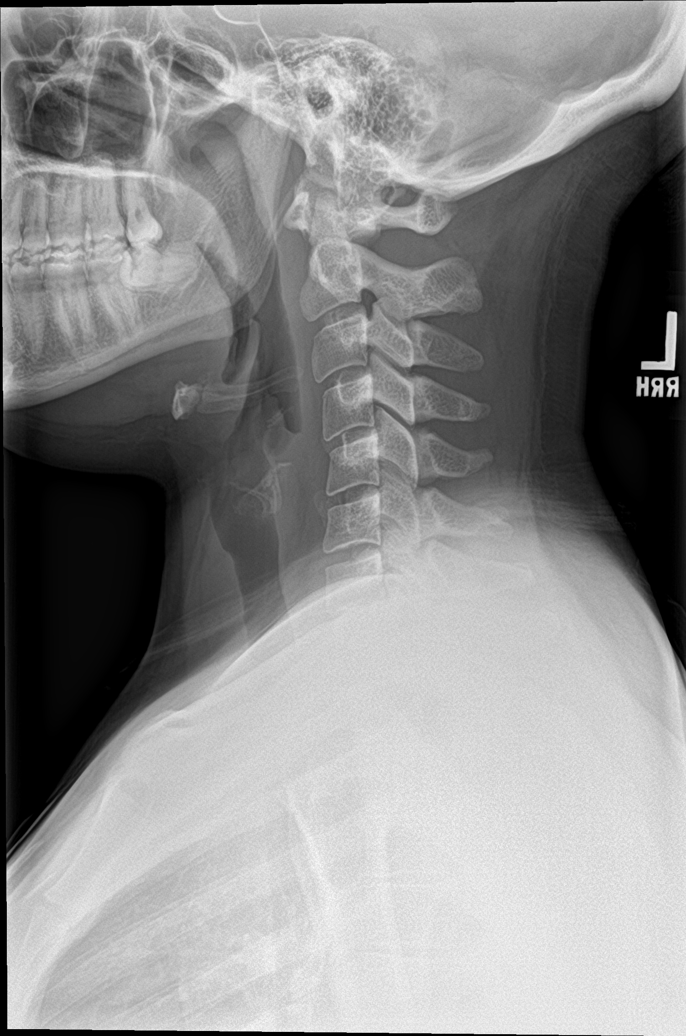

[c-spine obl (1 of 2)]
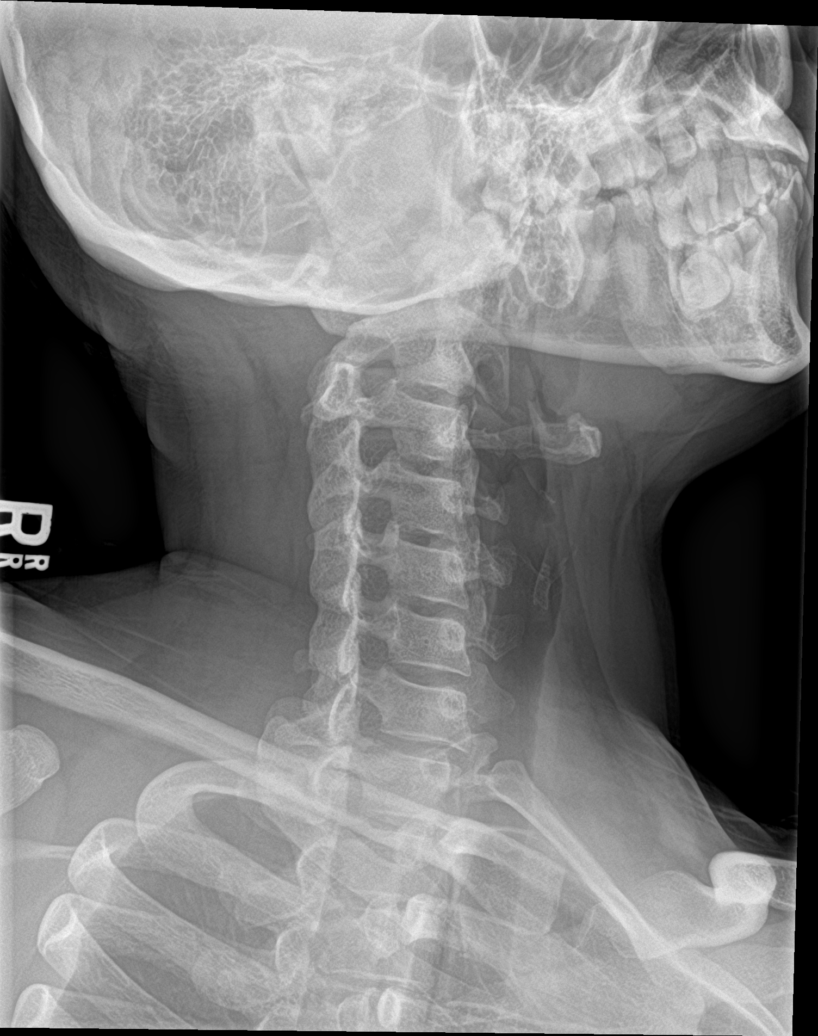

[c-spine obl (2 of 2)]
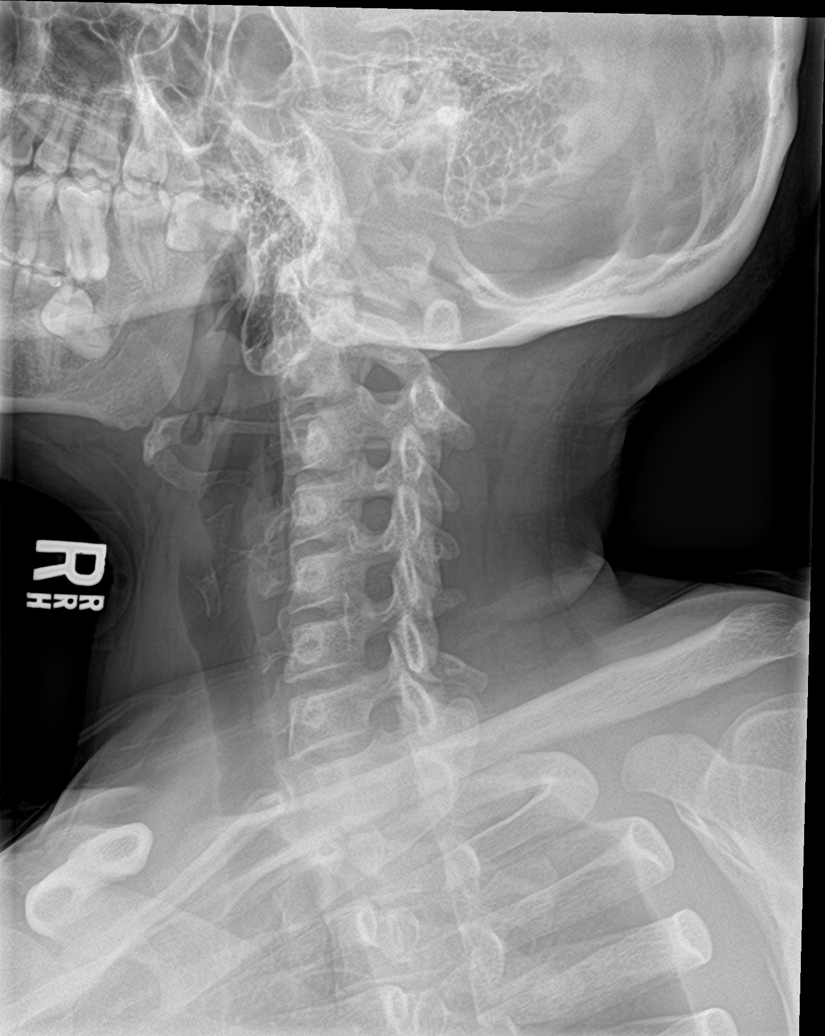

[c-spine ap]
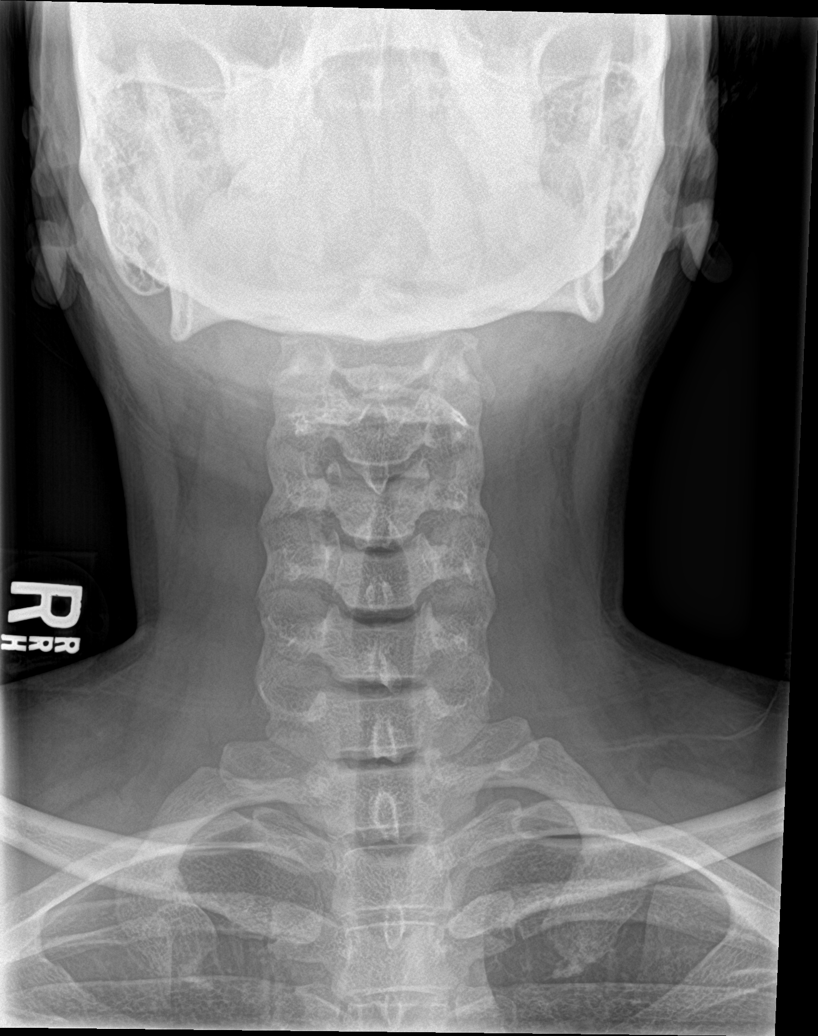

[c-spine open mouth]
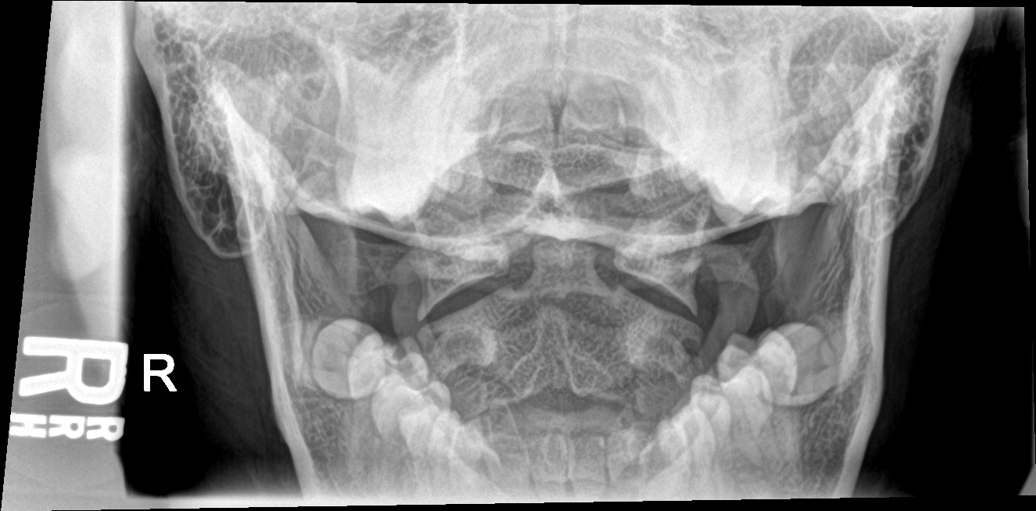

[5 of 5 positions shown; findings below may reference images not displayed]

FINDINGS: Mild straightening of the normal cervical lordosis. No significant
subluxation. No compression fracture or vertebral body abnormality.
No prevertebral soft tissue swelling. No foraminal narrowing. AP and
odontoid views unremarkable.
IMPRESSION: Other than mild straightening of the normal cervical lordosis,
unremarkable cervical spine radiographs.
